# Patient Record
Sex: Female | Born: 1935 | Race: White | Hispanic: No | Smoking: Never smoker
Health system: Southern US, Community
[De-identification: ages and names within clinical notes are randomized; demographics above are authoritative.]

## PROBLEM LIST (undated history)

## (undated) DIAGNOSIS — Z955 Presence of coronary angioplasty implant and graft: Secondary | ICD-10-CM

## (undated) DIAGNOSIS — I340 Nonrheumatic mitral (valve) insufficiency: Secondary | ICD-10-CM

## (undated) DIAGNOSIS — R0789 Other chest pain: Secondary | ICD-10-CM

## (undated) DIAGNOSIS — I351 Nonrheumatic aortic (valve) insufficiency: Secondary | ICD-10-CM

## (undated) DIAGNOSIS — G629 Polyneuropathy, unspecified: Secondary | ICD-10-CM

## (undated) DIAGNOSIS — Z951 Presence of aortocoronary bypass graft: Secondary | ICD-10-CM

## (undated) DIAGNOSIS — I10 Essential (primary) hypertension: Secondary | ICD-10-CM

## (undated) DIAGNOSIS — E119 Type 2 diabetes mellitus without complications: Secondary | ICD-10-CM

## (undated) DIAGNOSIS — I2510 Atherosclerotic heart disease of native coronary artery without angina pectoris: Secondary | ICD-10-CM

## (undated) DIAGNOSIS — I739 Peripheral vascular disease, unspecified: Secondary | ICD-10-CM

## (undated) DIAGNOSIS — E782 Mixed hyperlipidemia: Secondary | ICD-10-CM

## (undated) HISTORY — DX: Peripheral vascular disease, unspecified: I73.9

## (undated) HISTORY — DX: Other chest pain: R07.89

## (undated) HISTORY — DX: Mixed hyperlipidemia: E78.2

## (undated) HISTORY — DX: Nonrheumatic aortic (valve) insufficiency: I35.1

## (undated) HISTORY — DX: Type 2 diabetes mellitus without complications: E11.9

## (undated) HISTORY — DX: Presence of coronary angioplasty implant and graft: Z95.5

## (undated) HISTORY — DX: Atherosclerotic heart disease of native coronary artery without angina pectoris: I25.10

## (undated) HISTORY — DX: Nonrheumatic mitral (valve) insufficiency: I34.0

## (undated) HISTORY — DX: Presence of aortocoronary bypass graft: Z95.1

## (undated) HISTORY — DX: Essential (primary) hypertension: I10

## (undated) HISTORY — DX: Polyneuropathy, unspecified: G62.9

---

## 2013-05-22 ENCOUNTER — Encounter: Payer: Self-pay | Admitting: Internal Medicine

## 2013-05-22 ENCOUNTER — Ambulatory Visit (INDEPENDENT_AMBULATORY_CARE_PROVIDER_SITE_OTHER): Payer: Medicare Other | Admitting: Internal Medicine

## 2013-05-22 VITALS — BP 160/60 | HR 60 | Temp 98.0°F | Resp 20 | Ht 62.0 in | Wt 117.0 lb

## 2013-05-22 DIAGNOSIS — Z0000 Encounter for general adult medical examination without abnormal findings: Secondary | ICD-10-CM

## 2013-05-22 DIAGNOSIS — E119 Type 2 diabetes mellitus without complications: Secondary | ICD-10-CM

## 2013-05-22 LAB — HEMOGLOBIN A1C: Hgb A1c MFr Bld: 6.3 % (ref 4.6–6.5)

## 2013-05-22 NOTE — Patient Instructions (Signed)
Limit your sodium (Salt) intake   Please check your hemoglobin A1c every 3 months  Return in 3 months for follow-up   

## 2013-05-22 NOTE — Progress Notes (Signed)
Subjective:    Patient ID: Teresa Hawkins, female    DOB: [birth year], 79 y.o.   MRN: [id number]  HPI  79 year old patient who is seen today to establish. Her primary residence is in [place] but will also be spending considerable time in [place].  Past medical history is provided by the patient. No records for review She has a history of extensive coronary artery disease and is status post multiple stenting prior to CABG about 5 years ago. She states she has had probably a single stent placed after her bypass surgery. She has treated hypertension and diabetes. Additional problems include gastroesophageal reflux disease she is allergic rhinitis and mild dyslipidemia. She has hypothyroidism. She has history also of CAD and status post bilateral stenting. She has stable claudication. She's also followed for carotid artery stenosis. She's had a recent complete GI evaluation do to weight loss. This included the upper and lower endoscopy as well as CT scanning. At the present time she feels generally well. She attributes her weight loss do to situational stress following the death of her husband one year ago Social history she has a first cousin of Dr. Roseanne Kaufman and grew up in the [place] area. Dr. Alease Medina was her physician years ago. Lifelong nonsmoker. Widowed for about one year  Surgical procedures have included a prior cholecystectomy appendectomy tonsillectomy hysterectomy in addition to CABG  1. Risk factors, based on past  M,S,F history- patient has known coronary artery disease. Risk factors include hypertension and dyslipidemia  2.  Physical activities: Stable claudication but no exercise restrictions  3.  Depression/mood: No history of mood disorder or anxiety  4.  Hearing: No deficits  5.  ADL's: Independent in all aspects of daily living  6.  Fall risk: Moderate due to age  38.  Home safety: No problems identified  8.  Height weight, and visual acuity; height and weight  stable no change in visual acuity. Has had bilateral cataract extraction surgery  9.  Counseling: Followup cardiology.  10. Lab orders based on risk factors: Will check a hemoglobin A1c  11. Referral : Not appropriate at this time  12. Care plan: Continue heart healthy diet and aggressive risk factor modification  13. Cognitive assessment: Alert and oriented with normal affect     Review of Systems  Constitutional: Negative for fever, appetite change, fatigue and unexpected weight change.  HENT: Negative for hearing loss, ear pain, nosebleeds, congestion, sore throat, mouth sores, trouble swallowing, neck stiffness, dental problem, voice change, sinus pressure and tinnitus.   Eyes: Negative for photophobia, pain, redness and visual disturbance.  Respiratory: Negative for cough, chest tightness and shortness of breath.   Cardiovascular: Negative for chest pain, palpitations and leg swelling.       Stable claudication  Gastrointestinal: Negative for nausea, vomiting, abdominal pain, diarrhea, constipation, blood in stool, abdominal distention and rectal pain.  Genitourinary: Negative for dysuria, urgency, frequency, hematuria, flank pain, vaginal bleeding, vaginal discharge, difficulty urinating, genital sores, vaginal pain, menstrual problem and pelvic pain.  Musculoskeletal: Negative for back pain and arthralgias.  Skin: Negative for rash.  Neurological: Negative for dizziness, syncope, speech difficulty, weakness, light-headedness, numbness and headaches.  Hematological: Negative for adenopathy. Does not bruise/bleed easily.  Psychiatric/Behavioral: Negative for suicidal ideas, behavioral problems, self-injury, dysphoric mood and agitation. The patient is not nervous/anxious.        Objective:   Physical Exam  Constitutional: She is oriented to person, place, and time. She appears well-developed and well-nourished.  Blood pressure 140/60  HENT:  Head: Normocephalic.  Right Ear:  External ear normal.  Left Ear: External ear normal.  Mouth/Throat: Oropharynx is clear and moist.  Eyes: Conjunctivae and EOM are normal. Pupils are equal, round, and reactive to light.  Neck: Normal range of motion. Neck supple. No thyromegaly present.  Right bruit  Cardiovascular: Normal rate, regular rhythm and intact distal pulses.   Murmur heard. Grade 2/6 systolic murmur  Bilateral femoral bruits Dorsalis pedis pulses faintly palpable Posterior tibial pulses absent  Pulmonary/Chest: Effort normal and breath sounds normal.  Sternotomy scar  Abdominal: Soft. Bowel sounds are normal. She exhibits no mass. There is no tenderness.  Musculoskeletal: Normal range of motion.  Lymphadenopathy:    She has no cervical adenopathy.  Neurological: She is alert and oriented to person, place, and time.  Skin: Skin is warm and dry. No rash noted.  Psychiatric: She has a normal mood and affect. Her behavior is normal.          Assessment & Plan:   Preventive health examination Coronary artery disease PA D. with stable claudication Carotid artery stenosis with right bruit Hypertension Dyslipidemia Hypothyroidism Diabetes mellitus. Will check a hemoglobin A1c  Recheck 3 months

## 2013-10-20 ENCOUNTER — Other Ambulatory Visit: Payer: Self-pay | Admitting: Internal Medicine

## 2013-10-21 ENCOUNTER — Encounter: Payer: Self-pay | Admitting: Internal Medicine

## 2014-04-19 ENCOUNTER — Other Ambulatory Visit: Payer: Self-pay | Admitting: Internal Medicine

## 2014-09-04 ENCOUNTER — Other Ambulatory Visit: Payer: Self-pay | Admitting: Internal Medicine

## 2016-12-27 DIAGNOSIS — G629 Polyneuropathy, unspecified: Secondary | ICD-10-CM

## 2016-12-27 HISTORY — DX: Polyneuropathy, unspecified: G62.9

## 2017-03-06 DIAGNOSIS — E119 Type 2 diabetes mellitus without complications: Secondary | ICD-10-CM

## 2017-03-06 HISTORY — DX: Type 2 diabetes mellitus without complications: E11.9

## 2017-08-13 ENCOUNTER — Other Ambulatory Visit: Payer: Self-pay

## 2017-08-13 DIAGNOSIS — I739 Peripheral vascular disease, unspecified: Secondary | ICD-10-CM

## 2017-08-15 ENCOUNTER — Encounter: Payer: Self-pay | Admitting: Vascular Surgery

## 2017-10-08 ENCOUNTER — Ambulatory Visit (INDEPENDENT_AMBULATORY_CARE_PROVIDER_SITE_OTHER): Payer: Medicare Other | Admitting: Vascular Surgery

## 2017-10-08 ENCOUNTER — Encounter: Payer: Self-pay | Admitting: *Deleted

## 2017-10-08 ENCOUNTER — Encounter: Payer: Self-pay | Admitting: Vascular Surgery

## 2017-10-08 ENCOUNTER — Other Ambulatory Visit: Payer: Self-pay | Admitting: *Deleted

## 2017-10-08 ENCOUNTER — Ambulatory Visit (HOSPITAL_COMMUNITY)
Admission: RE | Admit: 2017-10-08 | Discharge: 2017-10-08 | Disposition: A | Discharge status: Home / Self Care | Payer: Medicare Other | Source: Ambulatory Visit | Attending: Vascular Surgery | Admitting: Vascular Surgery

## 2017-10-08 ENCOUNTER — Ambulatory Visit (INDEPENDENT_AMBULATORY_CARE_PROVIDER_SITE_OTHER)
Admission: RE | Admit: 2017-10-08 | Discharge: 2017-10-08 | Disposition: A | Discharge status: Home / Self Care | Payer: Medicare Other | Source: Ambulatory Visit | Attending: Vascular Surgery | Admitting: Vascular Surgery

## 2017-10-08 VITALS — BP 164/62 | HR 81 | Temp 97.0°F | Resp 16 | Ht 62.0 in | Wt 120.8 lb

## 2017-10-08 DIAGNOSIS — I739 Peripheral vascular disease, unspecified: Secondary | ICD-10-CM

## 2017-10-08 DIAGNOSIS — Z95820 Peripheral vascular angioplasty status with implants and grafts: Secondary | ICD-10-CM | POA: Diagnosis not present

## 2017-10-08 DIAGNOSIS — E1151 Type 2 diabetes mellitus with diabetic peripheral angiopathy without gangrene: Secondary | ICD-10-CM | POA: Insufficient documentation

## 2017-10-08 DIAGNOSIS — E785 Hyperlipidemia, unspecified: Secondary | ICD-10-CM | POA: Insufficient documentation

## 2017-10-08 DIAGNOSIS — I10 Essential (primary) hypertension: Secondary | ICD-10-CM | POA: Insufficient documentation

## 2017-10-08 LAB — VAS US LOWER EXTREMITY ARTERIAL DUPLEX
LPOPPPSV: 31 cm/s
LPTIBDISTSYS: -22 cm/s
LSFPPSV: -127 cm/s
Left ant tibial distal sys: -18 cm/s
Left popliteal dist sys PSV: -25 cm/s
RATIBDISTSYS: -16 cm/s
RPOPDPSV: -55 cm/s
RTIBDISTSYS: -54 cm/s
Right peroneal sys PSV: 29 cm/s
Right popliteal prox sys PSV: 73 cm/s

## 2017-10-08 NOTE — Progress Notes (Signed)
Reviewed instructions with patient .  

## 2017-10-08 NOTE — Progress Notes (Signed)
History of Present Illness:  Patient is a 83 y.o. year old female who presents for evaluation of claudication.  She has had symptoms for greater than a year.  She lives here and [place].  In [place] she has an angiogram that resulted in Right femoral artery stent from mid thigh to distal thigh/proximal popliteal artherectomy of right tibio-peroneal trunk.  Left femoral artery stent proximal to distal thigh/proximalpopliteal and atherectomy of the anterior tibial artery with angioplasty of the peroneal artery and tibio-peroneal trunk.  Her pre operative symptoms did not resolve after her procedure.   She currently describes a cramping sensation in the both lower extremity. There is not rest pain.  There is no history of ulcerations on the feet.  Atherosclerotic risk factors and other medical problems include DM, HTN and history of CAD s/p cardiac by pass.    Past Medical History:  Diagnosis Date  . Aortic valve regurgitation   . Atypical chest pain   . Claudication (HCC)   . Coronary atherosclerosis   . Essential hypertension   . Hx of right coronary artery stent placement   . Mitral valve regurgitation   . Mixed hyperlipidemia   . Neuropathy 12/27/2016  . Peripheral vascular disease (HCC)   . Status post coronary artery bypass grafting   . Type 2 diabetes mellitus (HCC) 03/06/2017    History reviewed. No pertinent surgical history.  ROS:   General:  No weight loss, Fever, chills  HEENT: No recent headaches, no nasal bleeding, no visual changes, no sore throat  Neurologic: No dizziness, blackouts, seizures. No recent symptoms of stroke or mini- stroke. No recent episodes of slurred speech, or temporary blindness.  Cardiac: No recent episodes of chest pain/pressure, no shortness of breath at rest.  No shortness of breath with exertion.  Denies history of atrial fibrillation or irregular heartbeat  Vascular: No history of rest pain in feet.  No history of claudication.  No history of  non-healing ulcer, No history of DVT   Pulmonary: No home oxygen, no productive cough, no hemoptysis,  No asthma or wheezing  Musculoskeletal:  [ ]  Arthritis, [ ]  Low back pain,  [ ]  Joint pain  Hematologic:No history of hypercoagulable state.  No history of easy bleeding.  No history of anemia  Gastrointestinal: No hematochezia or melena,  No gastroesophageal reflux, no trouble swallowing  Urinary: [ ]  chronic Kidney disease, [ ]  on HD - [ ]  MWF or [ ]  TTHS, [ ]  Burning with urination, [ ]  Frequent urination, [ ]  Difficulty urinating;   Skin: No rashes  Psychological: No history of anxiety,  No history of depression  Social History Social History   Tobacco Use  . Smoking status: Never Smoker  . Smokeless tobacco: Never Used  Substance Use Topics  . Alcohol use: No  . Drug use: No    Family History History reviewed. No pertinent family history.  Allergies  Allergies  Allergen Reactions  . Statins Other (See Comments)    Muscle and joint aches     Current Outpatient Medications  Medication Sig Dispense Refill  . amLODipine-benazepril (LOTREL) 5-40 MG capsule Take 1 capsule by mouth daily.    . benazepril (LOTENSIN) 40 MG tablet Take 40 mg by mouth daily.    . Calcium Carb-Cholecalciferol ([employer] CALCIUM 600+D) 600-800 MG-UNIT TABS Take 1 tablet by mouth daily.    . carvedilol (COREG) 12.5 MG tablet Take 6.25 mg by mouth 2 (two) times daily with a meal.    .  clopidogrel (PLAVIX) 75 MG tablet Take 75 mg by mouth daily.    Marland Kitchen. doxazosin (CARDURA) 2 MG tablet TAKE 1 TABLET BY MOUTH ONCE DAILY FOR BLOOD PRESSURE 90 tablet 0  . gabapentin (NEURONTIN) 300 MG capsule Take 300 mg by mouth. ONE TABLET IN AM, TWO TABLETS IN PM.    . gemfibrozil (LOPID) 600 MG tablet Take 600 mg by mouth 2 (two) times daily before a meal.    . hydrochlorothiazide (HYDRODIURIL) 12.5 MG tablet Take 12.5 mg by mouth daily.    Marland Kitchen. HYDROcodone-acetaminophen (NORCO/VICODIN) 5-325 MG tablet Take 1 tablet by  mouth every 6 (six) hours as needed for moderate pain.    Marland Kitchen. insulin glargine (LANTUS) 100 UNIT/ML injection Inject into the skin at bedtime.    Marland Kitchen. levothyroxine (SYNTHROID, LEVOTHROID) 75 MCG tablet Take 75 mcg by mouth daily before breakfast.    . Multiple Vitamins-Minerals (MULTIVITAMIN GUMMIES ADULTS) CHEW Chew 2 each by mouth daily.    Marland Kitchen. omeprazole (PRILOSEC OTC) 20 MG tablet Take 20 mg by mouth daily.     No current facility-administered medications for this visit.     Physical Examination  Vitals:   10/08/17 1420  BP: (!) 164/62  Pulse: 81  Resp: 16  Temp: (!) 97 F (36.1 C)  TempSrc: Oral  SpO2: 95%  Weight: 120 lb 12.8 oz (54.8 kg)  Height: 5\' 2"  (1.575 m)    Body mass index is 22.09 kg/m.  General:  Alert and oriented, no acute distress HEENT: Normal Neck: No bruit or JVD Pulmonary: Clear to auscultation bilaterally Cardiac: Regular Rate and Rhythm positive holistic murmur Abdomen: Soft, non-tender, non-distended, no mass, no scars Skin: No rash, braun ey discoloration of B shins  Extremity Pulses:  2+ radial, brachial, femoral, dorsalis pedis,  pulses bilaterally Musculoskeletal: No deformity or edema  Neurologic: Upper and lower extremity motor 5/5 and symmetric  DATA:  Arterial duplex Right biphasic flow CFA ,SFA, monophasic flow popliteal and tibials Left CFA biphasic, SFA stent occluded distally, monophasic popliteal and tibials  ABI Right 0.61 Left 0.49   ASSESSMENT:  PAD with symptoms of claudication B s/p stents B SFA with occluded left stent with reconstitution of tibials   PLAN: She is very active and wants to continue to be.  She wants further intervention if it will reduce her symptoms.  She has no history of non healing wounds and no rest pain.  We will schedule her for an angiogram with possible intervention.  She is not at risk of limb loss at this time.     Mosetta PigeonEmma Maureen Collins PA-C [facility]  I have  examined the patient, reviewed and agree with above.  I had a long discussion with the patient and her daughter present.  Have recommended observation only related to her lower extremity claudication especially in light of her advanced age of 89.  Patient is adamant that she is not willing to adhere to this recommendation.  Once further evaluation to determine if any options are possible for improvement in her walking symptoms.  Explained we could proceed with diagnostic arteriography to determine options.  She wishes to proceed and we will schedule this as an outpatient  Gretta Beganodd Tallin Hart, MD 10/08/2017 7:09 PM

## 2017-10-23 ENCOUNTER — Encounter (HOSPITAL_COMMUNITY)
Admission: RE | Disposition: A | Discharge status: Home / Self Care | Payer: Self-pay | Source: Ambulatory Visit | Attending: Vascular Surgery

## 2017-10-23 ENCOUNTER — Ambulatory Visit (HOSPITAL_COMMUNITY)
Admission: RE | Admit: 2017-10-23 | Discharge: 2017-10-23 | Disposition: A | Discharge status: Home / Self Care | Payer: Medicare Other | Source: Ambulatory Visit | Attending: Vascular Surgery | Admitting: Vascular Surgery

## 2017-10-23 DIAGNOSIS — Z951 Presence of aortocoronary bypass graft: Secondary | ICD-10-CM | POA: Diagnosis not present

## 2017-10-23 DIAGNOSIS — E1140 Type 2 diabetes mellitus with diabetic neuropathy, unspecified: Secondary | ICD-10-CM | POA: Insufficient documentation

## 2017-10-23 DIAGNOSIS — Z79890 Hormone replacement therapy (postmenopausal): Secondary | ICD-10-CM | POA: Insufficient documentation

## 2017-10-23 DIAGNOSIS — I10 Essential (primary) hypertension: Secondary | ICD-10-CM | POA: Diagnosis not present

## 2017-10-23 DIAGNOSIS — I2510 Atherosclerotic heart disease of native coronary artery without angina pectoris: Secondary | ICD-10-CM | POA: Diagnosis not present

## 2017-10-23 DIAGNOSIS — Y831 Surgical operation with implant of artificial internal device as the cause of abnormal reaction of the patient, or of later complication, without mention of misadventure at the time of the procedure: Secondary | ICD-10-CM | POA: Insufficient documentation

## 2017-10-23 DIAGNOSIS — Z95820 Peripheral vascular angioplasty status with implants and grafts: Secondary | ICD-10-CM | POA: Insufficient documentation

## 2017-10-23 DIAGNOSIS — Z79899 Other long term (current) drug therapy: Secondary | ICD-10-CM | POA: Insufficient documentation

## 2017-10-23 DIAGNOSIS — E1151 Type 2 diabetes mellitus with diabetic peripheral angiopathy without gangrene: Secondary | ICD-10-CM | POA: Insufficient documentation

## 2017-10-23 DIAGNOSIS — I739 Peripheral vascular disease, unspecified: Secondary | ICD-10-CM | POA: Diagnosis present

## 2017-10-23 DIAGNOSIS — Z794 Long term (current) use of insulin: Secondary | ICD-10-CM | POA: Insufficient documentation

## 2017-10-23 DIAGNOSIS — T82898A Other specified complication of vascular prosthetic devices, implants and grafts, initial encounter: Secondary | ICD-10-CM | POA: Insufficient documentation

## 2017-10-23 DIAGNOSIS — Z7902 Long term (current) use of antithrombotics/antiplatelets: Secondary | ICD-10-CM | POA: Diagnosis not present

## 2017-10-23 DIAGNOSIS — Z955 Presence of coronary angioplasty implant and graft: Secondary | ICD-10-CM | POA: Diagnosis not present

## 2017-10-23 DIAGNOSIS — I70213 Atherosclerosis of native arteries of extremities with intermittent claudication, bilateral legs: Secondary | ICD-10-CM | POA: Diagnosis not present

## 2017-10-23 HISTORY — PX: PERIPHERAL VASCULAR INTERVENTION: CATH118257

## 2017-10-23 HISTORY — PX: ABDOMINAL AORTOGRAM W/LOWER EXTREMITY: CATH118223

## 2017-10-23 LAB — POCT I-STAT, CHEM 8
BUN: 36 mg/dL — ABNORMAL HIGH (ref 6–20)
CHLORIDE: 103 mmol/L (ref 101–111)
Calcium, Ion: 1.14 mmol/L — ABNORMAL LOW (ref 1.15–1.40)
Creatinine, Ser: 0.9 mg/dL (ref 0.44–1.00)
GLUCOSE: 197 mg/dL — AB (ref 65–99)
HCT: 38 % (ref 36.0–46.0)
HEMOGLOBIN: 12.9 g/dL (ref 12.0–15.0)
POTASSIUM: 4.2 mmol/L (ref 3.5–5.1)
Sodium: 136 mmol/L (ref 135–145)
TCO2: 25 mmol/L (ref 22–32)

## 2017-10-23 LAB — POCT ACTIVATED CLOTTING TIME
ACTIVATED CLOTTING TIME: 219 s
ACTIVATED CLOTTING TIME: 197 s
Activated Clotting Time: 208 seconds
Activated Clotting Time: 186 seconds

## 2017-10-23 LAB — GLUCOSE, CAPILLARY: GLUCOSE-CAPILLARY: 158 mg/dL — AB (ref 65–99)

## 2017-10-23 SURGERY — ABDOMINAL AORTOGRAM W/LOWER EXTREMITY
Anesthesia: LOCAL

## 2017-10-23 MED ORDER — SODIUM CHLORIDE 0.9 % IV SOLN: 250.0000 mL | INTRAVENOUS | Status: DC | PRN

## 2017-10-23 MED ORDER — MIDAZOLAM HCL 2 MG/2ML IJ SOLN
INTRAMUSCULAR | Status: AC
  Filled 2017-10-23: qty 2

## 2017-10-23 MED ORDER — FENTANYL CITRATE (PF) 100 MCG/2ML IJ SOLN
INTRAMUSCULAR | Status: AC
  Filled 2017-10-23: qty 2

## 2017-10-23 MED ORDER — FENTANYL CITRATE (PF) 100 MCG/2ML IJ SOLN
INTRAMUSCULAR | Status: DC | PRN
  Administered 2017-10-23 (×2): 25 ug via INTRAVENOUS

## 2017-10-23 MED ORDER — VIPERSLIDE LUBRICANT OPTIME
TOPICAL | Status: DC | PRN
  Administered 2017-10-23: 10:00:00 via SURGICAL_CAVITY

## 2017-10-23 MED ORDER — LIDOCAINE HCL (PF) 1 % IJ SOLN
INTRAMUSCULAR | Status: AC
  Filled 2017-10-23: qty 30

## 2017-10-23 MED ORDER — MIDAZOLAM HCL 2 MG/2ML IJ SOLN
INTRAMUSCULAR | Status: DC | PRN
  Administered 2017-10-23: 1 mg via INTRAVENOUS

## 2017-10-23 MED ORDER — SODIUM CHLORIDE 0.9% FLUSH: 3.0000 mL | INTRAVENOUS | Status: DC | PRN

## 2017-10-23 MED ORDER — SODIUM CHLORIDE 0.9% FLUSH: 3.0000 mL | Freq: Two times a day (BID) | INTRAVENOUS | Status: DC

## 2017-10-23 MED ORDER — ASPIRIN EC 81 MG PO TBEC: 81.0000 mg | DELAYED_RELEASE_TABLET | Freq: Every day | ORAL | Status: DC

## 2017-10-23 MED ORDER — LABETALOL HCL 5 MG/ML IV SOLN
10.0000 mg | INTRAVENOUS | Status: DC | PRN
Start: 2017-10-23 — End: 2017-10-23

## 2017-10-23 MED ORDER — HEPARIN SODIUM (PORCINE) 1000 UNIT/ML IJ SOLN
INTRAMUSCULAR | Status: AC
  Filled 2017-10-23: qty 1

## 2017-10-23 MED ORDER — HYDRALAZINE HCL 20 MG/ML IJ SOLN
INTRAMUSCULAR | Status: DC | PRN
  Administered 2017-10-23 (×2): 10 mg via INTRAVENOUS

## 2017-10-23 MED ORDER — IODIXANOL 320 MG/ML IV SOLN
INTRAVENOUS | Status: DC | PRN
  Administered 2017-10-23: 140 mL via INTRA_ARTERIAL

## 2017-10-23 MED ORDER — HYDRALAZINE HCL 20 MG/ML IJ SOLN
5.0000 mg | INTRAMUSCULAR | Status: DC | PRN
Start: 2017-10-23 — End: 2017-10-23

## 2017-10-23 MED ORDER — SODIUM CHLORIDE 0.9 % IV SOLN
INTRAVENOUS | Status: DC
  Administered 2017-10-23: 08:00:00 via INTRAVENOUS

## 2017-10-23 MED ORDER — HEPARIN (PORCINE) IN NACL 2-0.9 UNIT/ML-% IJ SOLN
INTRAMUSCULAR | Status: AC | PRN
  Administered 2017-10-23: 1000 mL

## 2017-10-23 MED ORDER — HEPARIN (PORCINE) IN NACL 2-0.9 UNIT/ML-% IJ SOLN
INTRAMUSCULAR | Status: AC
  Filled 2017-10-23: qty 1000

## 2017-10-23 MED ORDER — HEPARIN SODIUM (PORCINE) 1000 UNIT/ML IJ SOLN
INTRAMUSCULAR | Status: DC | PRN
  Administered 2017-10-23: 6000 [IU] via INTRAVENOUS
  Administered 2017-10-23: 2000 [IU] via INTRAVENOUS

## 2017-10-23 MED ORDER — SODIUM CHLORIDE 0.9 % WEIGHT BASED INFUSION: 1.0000 mL/kg/h | INTRAVENOUS | Status: DC

## 2017-10-23 MED ORDER — OXYCODONE HCL 5 MG PO TABS: 5.0000 mg | ORAL_TABLET | ORAL | Status: DC | PRN

## 2017-10-23 SURGICAL SUPPLY — 28 items
BAG SNAP BAND KOVER 36X36 (MISCELLANEOUS) ×3 IMPLANT
BALLN STERLING OTW 5X220X150 (BALLOONS) ×3
BALLOON STERLING OTW 5X220X150 (BALLOONS) ×2 IMPLANT
BUR JETSTREAM XC 2.1/3.0 (BURR) ×2 IMPLANT
BURR JETSTREAM XC 2.1/3.0 (BURR) ×3
CATH ANGIO 5F BER2 65CM (CATHETERS) ×3 IMPLANT
CATH OMNI FLUSH 5F 65CM (CATHETERS) ×3 IMPLANT
CATH QUICKCROSS .035X135CM (MICROCATHETER) ×3 IMPLANT
COVER PRB 48X5XTLSCP FOLD TPE (BAG) ×2 IMPLANT
COVER PROBE 5X48 (BAG) ×1
DEVICE CONTINUOUS FLUSH (MISCELLANEOUS) ×3 IMPLANT
DEVICE EMBOSHIELD NAV6 2.5-4.8 (WIRE) ×3 IMPLANT
DEVICE TORQUE .025-.038 (MISCELLANEOUS) ×3 IMPLANT
GUIDEWIRE ANGLED .035X260CM (WIRE) ×3 IMPLANT
KIT ENCORE 26 ADVANTAGE (KITS) ×3 IMPLANT
KIT MICROINTRODUCER STIFF 5F (SHEATH) ×3 IMPLANT
KIT PV (KITS) ×3 IMPLANT
LUBRICANT VIPERSLIDE CORONARY (MISCELLANEOUS) ×3 IMPLANT
SHEATH HIGHFLEX ANSEL 7FR 55CM (SHEATH) ×3 IMPLANT
SHEATH PINNACLE 5F 10CM (SHEATH) ×3 IMPLANT
STENT VIABAHN 5X100X120 (Permanent Stent) ×3 IMPLANT
STENT VIABAHN 5X250X120 (Permanent Stent) ×3 IMPLANT
SYR MEDRAD MARK V 150ML (SYRINGE) ×3 IMPLANT
TRANSDUCER W/STOPCOCK (MISCELLANEOUS) ×3 IMPLANT
TRAY PV CATH (CUSTOM PROCEDURE TRAY) ×3 IMPLANT
WIRE BAREWIRE WORK .014X315CM (WIRE) ×3 IMPLANT
WIRE BENTSON .035X145CM (WIRE) ×3 IMPLANT
WIRE G V18X300CM (WIRE) ×3 IMPLANT

## 2017-10-23 NOTE — H&P (Signed)
   History and Physical Update  The patient was interviewed and re-examined.  The patient's previous History and Physical has been reviewed and is unchanged from Dr. Arbie CookeyEarly office note. She has short distance claudication in the left more than the right. History of occluded sfa stents with a failed recanalization attempt in the past. Will attempt left leg revascularization today.  Teresa Hawkins C. Randie Heinzain, MD [facility] Office: [phone] Pager: [phone]   10/23/2017, 9:04 AM

## 2017-10-23 NOTE — Progress Notes (Signed)
Rt groin dressing saturated and oozing out from dressing. No hematoma. Last ACT 186 at 1229

## 2017-10-23 NOTE — Discharge Instructions (Signed)

## 2017-10-23 NOTE — Progress Notes (Signed)
Site area: rt groin fa sheath Site Prior to Removal:  Level 0 Pressure Applied For: 30 minutes Manual:   yes Patient Status During Pull:  stable Post Pull Site:  Level  0 Post Pull Instructions Given:  yes Post Pull Pulses Present: yes Dressing Applied:  Gauze and tegaderm Bedrest begins @ 1330 Comments:

## 2017-10-23 NOTE — Op Note (Signed)
Patient name: Teresa Hawkins MRN: [id number] DOB: 1921-08-02 Sex: female  10/23/2017 Pre-operative Diagnosis: claudication Post-operative diagnosis:  Same Surgeon:  Apolinar Junes C. Randie Heinz, MD Procedure Performed: 1.  US guided cannulation of left common femoral artery 2.  Aortogram with bilateral runoff 3.  Atherectomy of left SFA and popliteal with jetstream 2.1-3.0 xc 4.  Stent of left sfa and popliteal arteries with 5 x and 5 x viabahn stents 5.  Moderate sedation with fentanyl and versed for 96 minutes   Indications: 84 year old female with history of stenting of her bilateral superficial femoral arteries.  She now has short distance life-limiting claudication and is indicated for angiogram with possible intervention.  Findings: Aorta and iliac segments have diffuse disease but no flow-limiting stenoses.  On the left there is a diminutive proximal SFA followed by multiple stents in the mid SFA to the popliteal artery above the knee that occluded above the adductor.  There appears to be a covered stent distally.  She reconstitutes above the knee popliteal artery and has dominant posterior tibial artery runoff to the foot.  On the right side the SFA appears as a string proximally and then there is a patent stent in the mid SFA and she appears to only have posterior tibial runoff.  Following intervention on the SFA and popliteal arteries there is no longer occlusion and now there are patent stents.   Procedure:  The patient was identified in the holding area and taken to room 8.  The patient was then placed supine on the table and prepped and draped in the usual sterile fashion.  A time out was called.  Ultrasound was used to evaluate the right common femoral artery.  It was patent .  A digital ultrasound image was acquired.  A micropuncture needle was used to access the right common femoral artery under ultrasound guidance.  An 018 wire was advanced without resistance and a micropuncture  sheath was placed.  The 018 wire was removed and a benson wire was placed.  The micropuncture sheath was exchanged for a 5 french sheath.  An omniflush catheter was advanced over the wire to the level of L-1.  An abdominal angiogram was obtained followed by bilateral lower extremity runoff.  With the above findings we were able to go up and over with a Bentson wire and Omni catheter.  We then placed a long 7 French sheath and the patient was given 6000 units of heparin followed by an additional 2000 after the ACT returned.  We used a bare catheter to select the SFA and then used a Glidewire and quick cross catheter to cross the occluded SFA stents and demonstrated intraluminal access at the knee.  We then exchanged for a bare wire followed by a small nav 6 distal embolic protection device.  We perform jetstream atherectomy.  Following this we performed 5 mm balloon angioplasty of the entirety of the stents.  Following this we performed angiogram and the distal stent was still occluded and there was the presence of an AV fistula.  We then removed our embolic protection device and had a similar result.  With this we elected to stent distally to proximally.  We exchanged through a catheter after again demonstrating intraluminal access and placed a V 18 wire into the anterior tibial artery.  A 5 x 250 mm stent was then placed distally followed by a 5 x 100 mm stent proximally.  We then postdilated these with 5 mm balloon.  Following  this we had no further occlusions in the SFA or popliteal had brisk runoff with posterior tibial being the dominant runoff to the foot.  Patient tolerated procedure well.  We then removed the sheath back to the external iliac artery on the right and the wire was removed.  Sheath was pulled in postop holding.  Contrast 140 cc.   Issabelle Mcraney C. Randie Heinzain, MD [facility] Office: [phone] Pager: [phone]

## 2017-10-24 ENCOUNTER — Encounter (HOSPITAL_COMMUNITY): Payer: Self-pay | Admitting: Vascular Surgery

## 2017-10-24 ENCOUNTER — Telehealth: Payer: Self-pay | Admitting: Vascular Surgery

## 2017-10-24 NOTE — Telephone Encounter (Signed)
-----   Message from Sharee PimpleMarilyn K McChesney, RN sent at 10/24/2017 10:37 AM EST ----- Regarding: 3-4 weeks    ----- Message ----- From: Maeola Harmanain, Brandon Christopher, MD Sent: 10/23/2017  12:02 PM To: [facility] Charge Pool  Collier Bullockdith Johndrow [id number] Feb 01, 1921  10/23/2017 Pre-operative Diagnosis: claudication  Surgeon:  Apolinar JunesBrandon C. Randie Heinzain, MD  Procedure Performed: 1.  US guided cannulation of left common femoral artery 2.  Aortogram with bilateral runoff 3.  Atherectomy of left SFA and popliteal with jetstream 2.1-3.0 xc 4.  Stent of left sfa and popliteal arteries with 5 x 250mm and 5 x 100mm viabahn stents 5.  Moderate sedation with fentanyl and versed for 96 minutes  F/u in 3-4 weeks with left lower extremity duplex and ABI's

## 2017-10-24 NOTE — Telephone Encounter (Signed)
Sched labs 11/11/17 at 1:00 and MD 11/22/17 at 11:30. Lm on cell#.

## 2017-10-25 ENCOUNTER — Other Ambulatory Visit: Payer: Self-pay

## 2017-10-25 DIAGNOSIS — Z48812 Encounter for surgical aftercare following surgery on the circulatory system: Secondary | ICD-10-CM

## 2017-10-25 DIAGNOSIS — Z48813 Encounter for surgical aftercare following surgery on the respiratory system: Secondary | ICD-10-CM

## 2017-10-25 DIAGNOSIS — I739 Peripheral vascular disease, unspecified: Secondary | ICD-10-CM

## 2017-11-04 ENCOUNTER — Telehealth: Payer: Self-pay | Admitting: Vascular Surgery

## 2017-11-04 NOTE — Telephone Encounter (Signed)
Pt lm w/ triage asking for appt. Appt already made and I had lm on their cell#. Lm on cell# for pt to call back.

## 2017-11-04 NOTE — Telephone Encounter (Signed)
-----   Message from Sharee PimpleMarilyn K McChesney, RN sent at 11/04/2017  2:56 PM EST ----- Regarding: This may be a duplicate, but pt called for this appt    ----- Message ----- From: Maeola Harmanain, Brandon Christopher, MD Sent: 10/23/2017  12:02 PM To: [facility] Charge Pool  Collier Bullockdith Giuliani [id number] 1921/07/31  10/23/2017 Pre-operative Diagnosis: claudication  Surgeon:  Apolinar JunesBrandon C. Randie Heinzain, MD  Procedure Performed: 1.  US guided cannulation of left common femoral artery 2.  Aortogram with bilateral runoff 3.  Atherectomy of left SFA and popliteal with jetstream 2.1-3.0 xc 4.  Stent of left sfa and popliteal arteries with 5 x 250mm and 5 x 100mm viabahn stents 5.  Moderate sedation with fentanyl and versed for 96 minutes  F/u in 3-4 weeks with left lower extremity duplex and ABI's

## 2017-11-11 ENCOUNTER — Ambulatory Visit (INDEPENDENT_AMBULATORY_CARE_PROVIDER_SITE_OTHER)
Admission: RE | Admit: 2017-11-11 | Discharge: 2017-11-11 | Disposition: A | Discharge status: Home / Self Care | Payer: Medicare Other | Source: Ambulatory Visit | Attending: Vascular Surgery | Admitting: Vascular Surgery

## 2017-11-11 ENCOUNTER — Ambulatory Visit (HOSPITAL_COMMUNITY)
Admission: RE | Admit: 2017-11-11 | Discharge: 2017-11-11 | Disposition: A | Discharge status: Home / Self Care | Payer: Medicare Other | Source: Ambulatory Visit | Attending: Vascular Surgery | Admitting: Vascular Surgery

## 2017-11-11 DIAGNOSIS — I739 Peripheral vascular disease, unspecified: Secondary | ICD-10-CM

## 2017-11-11 DIAGNOSIS — Z48812 Encounter for surgical aftercare following surgery on the circulatory system: Secondary | ICD-10-CM

## 2017-11-11 DIAGNOSIS — I70202 Unspecified atherosclerosis of native arteries of extremities, left leg: Secondary | ICD-10-CM | POA: Insufficient documentation

## 2017-11-11 LAB — VAS US LOWER EXTREMITY ARTERIAL DUPLEX: Right super femoral prox sys PSV: 203 cm/s

## 2017-11-13 ENCOUNTER — Other Ambulatory Visit: Payer: Self-pay

## 2017-11-13 ENCOUNTER — Emergency Department (HOSPITAL_COMMUNITY): Payer: Medicare Other

## 2017-11-13 ENCOUNTER — Encounter (HOSPITAL_COMMUNITY): Payer: Self-pay | Admitting: Family Medicine

## 2017-11-13 ENCOUNTER — Emergency Department (HOSPITAL_COMMUNITY)
Admission: EM | Admit: 2017-11-13 | Discharge: 2017-11-13 | Disposition: A | Discharge status: Home / Self Care | Payer: Medicare Other | Attending: Emergency Medicine | Admitting: Emergency Medicine

## 2017-11-13 DIAGNOSIS — Y999 Unspecified external cause status: Secondary | ICD-10-CM | POA: Insufficient documentation

## 2017-11-13 DIAGNOSIS — Z951 Presence of aortocoronary bypass graft: Secondary | ICD-10-CM | POA: Diagnosis not present

## 2017-11-13 DIAGNOSIS — I2510 Atherosclerotic heart disease of native coronary artery without angina pectoris: Secondary | ICD-10-CM | POA: Insufficient documentation

## 2017-11-13 DIAGNOSIS — S0083XA Contusion of other part of head, initial encounter: Secondary | ICD-10-CM | POA: Insufficient documentation

## 2017-11-13 DIAGNOSIS — Y939 Activity, unspecified: Secondary | ICD-10-CM | POA: Diagnosis not present

## 2017-11-13 DIAGNOSIS — S0990XA Unspecified injury of head, initial encounter: Secondary | ICD-10-CM | POA: Diagnosis present

## 2017-11-13 DIAGNOSIS — Y92003 Bedroom of unspecified non-institutional (private) residence as the place of occurrence of the external cause: Secondary | ICD-10-CM | POA: Insufficient documentation

## 2017-11-13 DIAGNOSIS — W2209XA Striking against other stationary object, initial encounter: Secondary | ICD-10-CM | POA: Insufficient documentation

## 2017-11-13 DIAGNOSIS — S51812A Laceration without foreign body of left forearm, initial encounter: Secondary | ICD-10-CM | POA: Insufficient documentation

## 2017-11-13 DIAGNOSIS — I10 Essential (primary) hypertension: Secondary | ICD-10-CM | POA: Insufficient documentation

## 2017-11-13 DIAGNOSIS — Z7902 Long term (current) use of antithrombotics/antiplatelets: Secondary | ICD-10-CM | POA: Insufficient documentation

## 2017-11-13 DIAGNOSIS — E1140 Type 2 diabetes mellitus with diabetic neuropathy, unspecified: Secondary | ICD-10-CM | POA: Diagnosis not present

## 2017-11-13 DIAGNOSIS — Z79899 Other long term (current) drug therapy: Secondary | ICD-10-CM | POA: Diagnosis not present

## 2017-11-13 NOTE — ED Provider Notes (Signed)
[facility]-EMERGENCY DEPT Provider Note   CSN: [id number] Arrival date & time: 11/13/17  1657     History   Chief Complaint Chief Complaint  Patient presents with  . Fall    HPI Teresa Hawkins is a 84 y.o. female.  Chief complaint is fall, forehead abrasion, left arm skin tear  HPI : 84 year old female.  She is here with her daughter.  She had a fall at home.  She takes Plavix and aspirin.  She returned from her bed and fell and struck her forehead against the edge of a stationary cabinet.  No loss of consciousness.  No confusion no weakness.  Earlier the today she was walking out of her room and caught her left forearm on a protruding sharp object on a door jam and has a skin tear to her left forearm as well.  No pain.  Past Medical History:  Diagnosis Date  . Aortic valve regurgitation   . Atypical chest pain   . Claudication (HCC)   . Coronary atherosclerosis   . Essential hypertension   . Hx of right coronary artery stent placement   . Mitral valve regurgitation   . Mixed hyperlipidemia   . Neuropathy 12/27/2016  . Peripheral vascular disease (HCC)   . Status post coronary artery bypass grafting   . Type 2 diabetes mellitus (HCC) 03/06/2017    There are no active problems to display for this patient.   Past Surgical History:  Procedure Laterality Date  . ABDOMINAL AORTOGRAM W/LOWER EXTREMITY N/A 10/23/2017   Procedure: ABDOMINAL AORTOGRAM W/LOWER EXTREMITY;  Surgeon: Maeola Harman, MD;  Location: [facility] INVASIVE CV LAB;  Service: Cardiovascular;  Laterality: N/A;  Bilateral   . PERIPHERAL VASCULAR INTERVENTION  10/23/2017   Procedure: PERIPHERAL VASCULAR INTERVENTION;  Surgeon: Maeola Harman, MD;  Location: [facility] INVASIVE CV LAB;  Service: Cardiovascular;;  Lt. SFA    OB History    No data available       Home Medications    Prior to Admission medications   Medication Sig Start Date End Date Taking? Authorizing Provider    benazepril (LOTENSIN) 40 MG tablet Take 40 mg by mouth daily.   Yes [provider]  CALCIUM-VITAMIN D PO Take 1 tablet by mouth at bedtime.   Yes [provider]  carvedilol (COREG) 6.25 MG tablet Take 6.25 mg by mouth 2 (two) times daily.   Yes [provider]  clopidogrel (PLAVIX) 75 MG tablet Take 75 mg by mouth daily.   Yes [provider]  doxazosin (CARDURA) 2 MG tablet TAKE 1 TABLET BY MOUTH ONCE DAILY FOR BLOOD PRESSURE Patient taking differently: TAKE 1 TABLET BY MOUTH ONCE DAILY AT NIGHT FOR BLOOD PRESSURE 09/06/14  Yes Gordy Savers, MD  gabapentin (NEURONTIN) 300 MG capsule Take 300 mg by mouth 2 (two) times daily. TAKE 1 CAPSULE (300MG ) IN THE MORNING & 2 CAPSULES (600MG ) BY MOUTH IN THE EVENING.   Yes [provider]  hydrochlorothiazide (HYDRODIURIL) 12.5 MG tablet Take 12.5 mg by mouth daily.   Yes [provider]  insulin glargine (LANTUS) 100 UNIT/ML injection Inject 10 Units into the skin at bedtime. BASED ON SLIDING SCALE TYPICALLY USES 10 UNITS   Yes [provider]  levothyroxine (SYNTHROID, LEVOTHROID) 75 MCG tablet Take 75 mcg by mouth at bedtime.    Yes [provider]  Multiple Vitamins-Minerals (MULTIVITAMIN GUMMIES ADULTS) CHEW Chew 2 each by mouth daily. CENTRUM   Yes [provider]  omeprazole (PRILOSEC OTC) 20 MG tablet Take 20 mg by mouth daily before breakfast.    Yes [provider]  Polyethyl Glycol-Propyl Glycol (LUBRICANT EYE DROPS) 0.4-0.3 % SOLN Place 1-2 drops into both eyes 3 (three) times daily as needed (for dry eyes.).   Yes [provider]    Family History History reviewed. No pertinent family history.  Social History Social History   Tobacco Use  . Smoking status: Never Smoker  . Smokeless tobacco: Never Used  Substance Use Topics  . Alcohol use: No  . Drug use: No     Allergies   Monosodium glutamate and Statins   Review of  Systems Review of Systems  Constitutional: Negative for appetite change, chills, diaphoresis, fatigue and fever.  HENT: Negative for mouth sores, sore throat and trouble swallowing.   Eyes: Negative for visual disturbance.  Respiratory: Negative for cough, chest tightness, shortness of breath and wheezing.   Cardiovascular: Negative for chest pain.  Gastrointestinal: Negative for abdominal distention, abdominal pain, diarrhea, nausea and vomiting.  Endocrine: Negative for polydipsia, polyphagia and polyuria.  Genitourinary: Negative for dysuria, frequency and hematuria.  Musculoskeletal: Negative for gait problem.  Skin: Negative for color change, pallor and rash.       Skin tear  Neurological: Positive for headaches. Negative for dizziness, syncope and light-headedness.  Hematological: Does not bruise/bleed easily.  Psychiatric/Behavioral: Negative for behavioral problems and confusion.     Physical Exam Updated Vital Signs BP (!) 159/64   Pulse 65   Temp 97.8 F (36.6 C) (Oral)   Resp 16   Ht 5\' 2"  (1.575 m)   Wt 54.4 kg (120 lb)   SpO2 95%   BMI 21.95 kg/m   Physical Exam  Constitutional: She is oriented to person, place, and time. She appears well-developed and well-nourished. No distress.  HENT:  Head: Normocephalic.  Minimal abrasion/laceration on the midline forehead.  No surrounding marked ecchymosis or soft tissue swelling.  Partial-thickness laceration.  Minimal bleeding noted.  No blood over the TMs, mastoids, or from ears nose or mouth.  Nontender over the midline neck and spine.  Eyes: Conjunctivae are normal. Pupils are equal, round, and reactive to light. No scleral icterus.  Neck: Normal range of motion. Neck supple. No thyromegaly present.  Cardiovascular: Normal rate and regular rhythm. Exam reveals no gallop and no friction rub.  No murmur heard. Pulmonary/Chest: Effort normal and breath sounds normal. No respiratory distress. She has no wheezes. She has no  rales.  Abdominal: Soft. Bowel sounds are normal. She exhibits no distension. There is no tenderness. There is no rebound.  Musculoskeletal: Normal range of motion.  Neurological: She is alert and oriented to person, place, and time.  Skin: Skin is warm and dry. No rash noted.  10 cm curvilinear skin tear on the left forearm.  Normal neurovascular and tendon exam distally.  Full range of motion.  Psychiatric: She has a normal mood and affect. Her behavior is normal.     ED Treatments / Results  Labs (all labs ordered are listed, but only abnormal results are displayed) Labs Reviewed - No data to display  EKG  EKG Interpretation None       Radiology Ct Head Wo Contrast  Result Date: 11/13/2017 CLINICAL DATA:  Dizziness. Patient fell this morning with an abrasion on the forehead. EXAM: CT HEAD WITHOUT CONTRAST TECHNIQUE: Contiguous axial images were obtained from the base of the skull through the vertex without intravenous contrast. COMPARISON:  None. FINDINGS:  Brain: Mild, likely age-appropriate atrophy with sulcal prominence centralized volume loss with commensurate ex vacuo dilatation of the ventricular system. Scattered minimal periventricular hypodensities compatible microvascular ischemic disease. Gray-white differentiation is otherwise well maintained without CT evidence of acute large territory infarct. No intraparenchymal or extra-axial mass or hemorrhage. Normal configuration of the ventricles and basilar cisterns. No midline shift. Vascular: Intracranial atherosclerosis. If skull: No displaced calvarial fracture with special attention paid to the forehead. Sinuses/Orbits: Limited visualization of the paranasal sinuses and mastoid air cells is normal. No air-fluid levels. Post bilateral cataract surgery. Other: Suspected minimal amount of soft tissue swelling about the midline of the forehead (image 14, series 2; sagittal image 29, series 6) without associated radiopaque foreign body  or displaced calvarial fracture. IMPRESSION: 1. Minimal amount of soft tissue swelling about the midline of the forehead without associated displaced calvarial fracture, radiopaque foreign body or acute intracranial process. 2. Mild, likely age-appropriate, atrophy and microvascular ischemic disease. Electronically Signed   By: Simonne Come M.D.   On: 11/13/2017 18:20    Procedures Procedures (including critical care time)  Medications Ordered in ED Medications - No data to display   Initial Impression / Assessment and Plan / ED Course  I have reviewed the triage vital signs and the nursing notes.  Pertinent labs & imaging results that were available during my care of the patient were reviewed by me and considered in my medical decision making (see chart for details).    She prominence around the area of the contusion of the forehead.  No intracerebral abnormality paraxial fluid or blood collection.  Wounds were dressed.  Discharged home.  Continue Plavix.  Final Clinical Impressions(s) / ED Diagnoses   Final diagnoses:  Contusion of forehead, initial encounter  Skin tear of left forearm without complication, initial encounter    ED Discharge Orders    None       Rolland Porter, MD 11/13/17 [facility] B[facility]

## 2017-11-13 NOTE — Discharge Instructions (Signed)
Leave dressing on forearm for 48 hours.   After 48 hours, remove daily, clean with soap and water, apply antibiotic ointment, then re-cover with bandage.

## 2017-11-13 NOTE — ED Triage Notes (Signed)
Patient reports she took OTC sleep aid two nights ago that she normally does not take. Yesterday, she reports she felt woozy all day. This morning she fell, hit her head on the cabinet, with no LOC. Patient has an abrasion with band-aid on the mid forehead. Patient does take Warfarin. Later today, she hit her left arm on the cabinet and it has been bleeding. Patient went to urgent care and referred to ED for CT of head. Patient is at baseline.

## 2017-11-22 ENCOUNTER — Ambulatory Visit: Payer: Medicare Other | Admitting: Vascular Surgery

## 2019-06-28 IMAGING — CT CT HEAD W/O CM
3 series · 15 of 47 positions shown, 18 images · non-contrast
Comparison: None.

CLINICAL DATA: Dizziness. Patient fell this morning with an
abrasion on the forehead.

EXAM:
CT HEAD WITHOUT CONTRAST
TECHNIQUE: Contiguous axial images were obtained from the base of the skull
through the vertex without intravenous contrast.

[Series 2: head wo · axial · 0.38mm/px · z∈[-205,-80]mm · 9 of 30 slices shown, 12 images]
[im 3/30  brain]
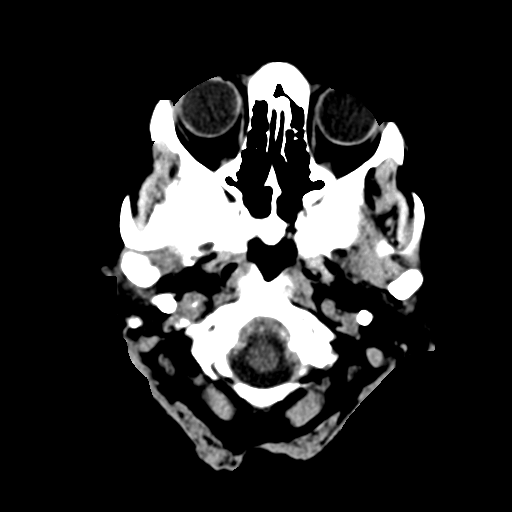
[im 3/30  bone]
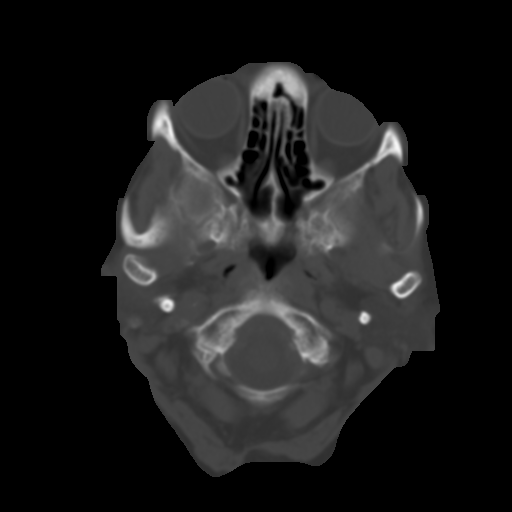
[im 6/30  brain]
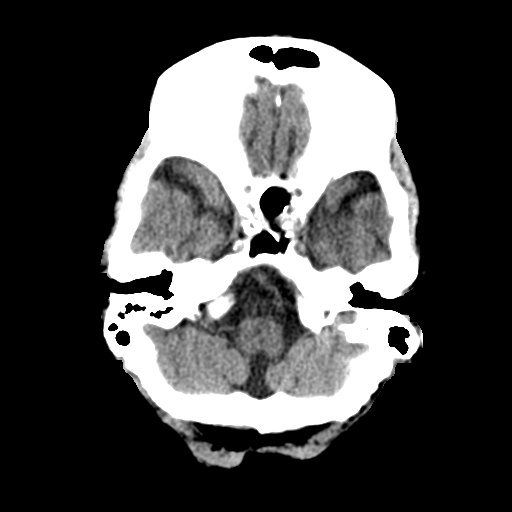
[im 9/30  brain]
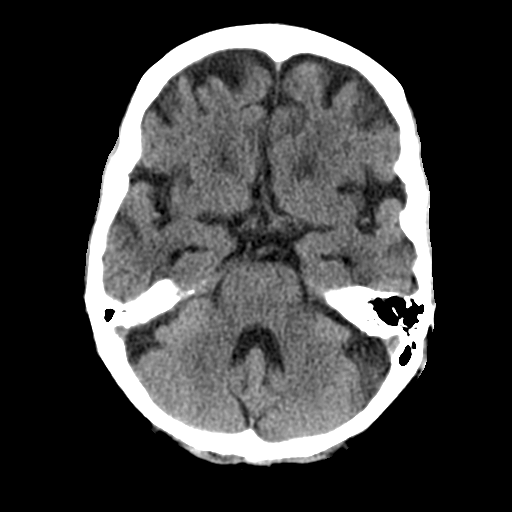
[im 12/30  brain]
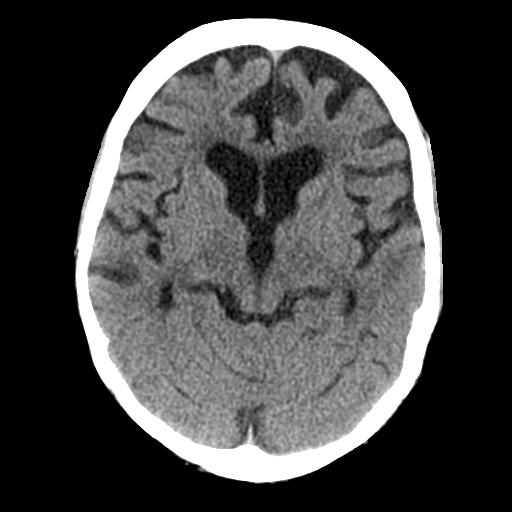
[im 16/30  brain]
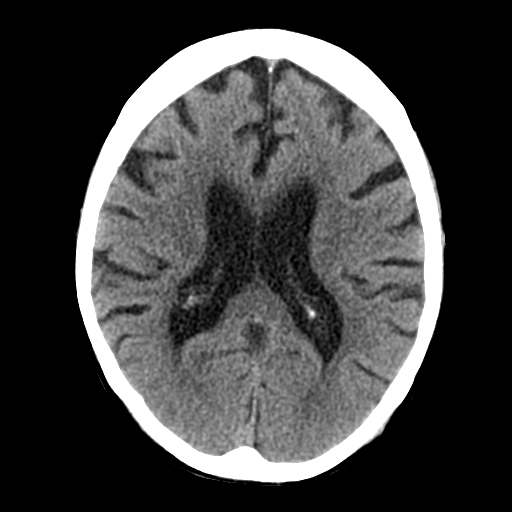
[im 16/30  bone]
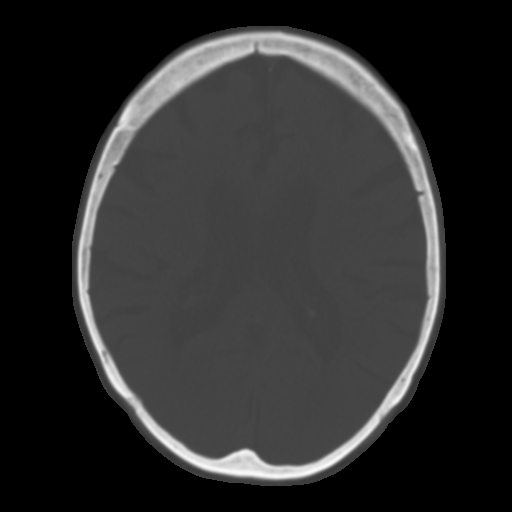
[im 19/30  brain]
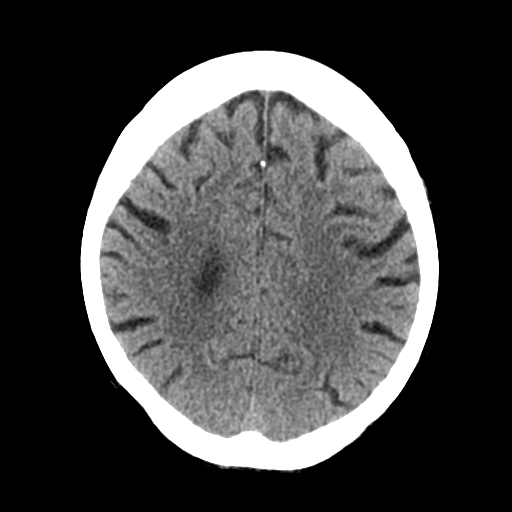
[im 22/30  brain]
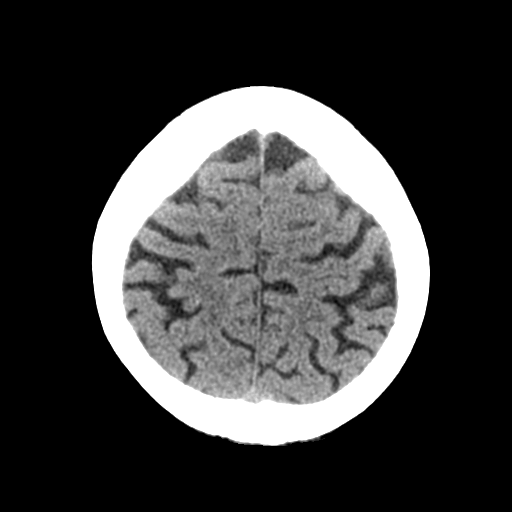
[im 25/30  brain]
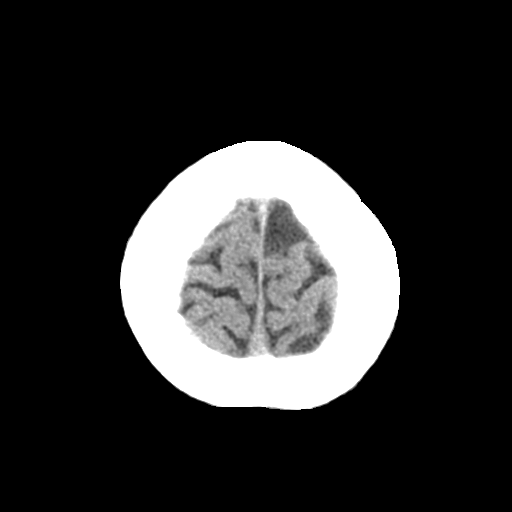
[im 28/30  brain]
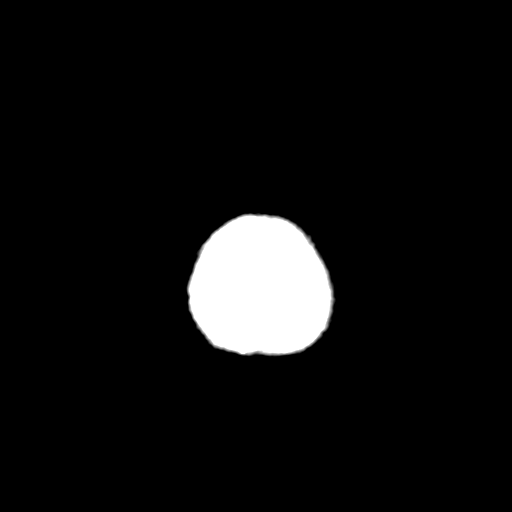
[im 28/30  bone]
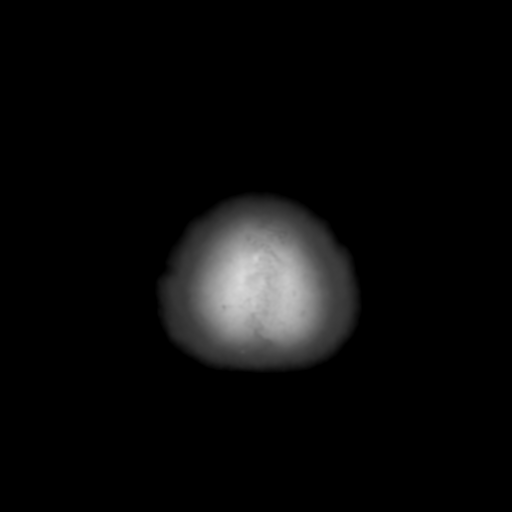

[Series 5: coronal soft tissue · coronal · 0.31mm/px · 3 of 64 slices shown]
[im 22/64  brain]
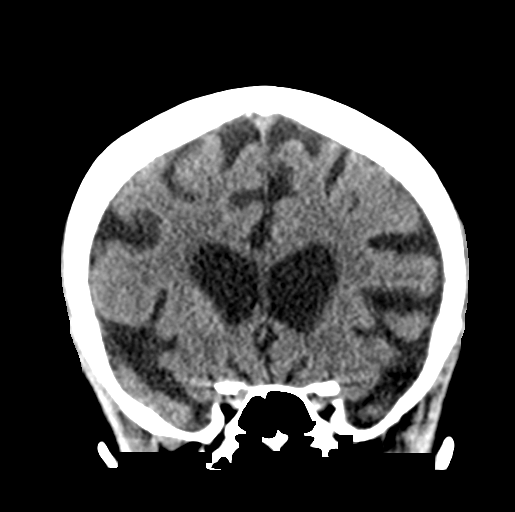
[im 29/64  brain]
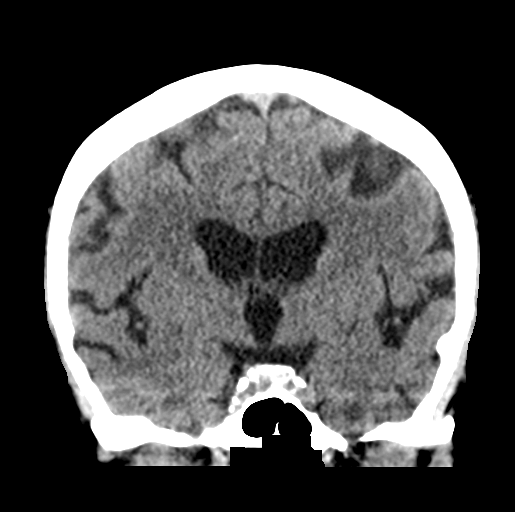
[im 36/64  brain]
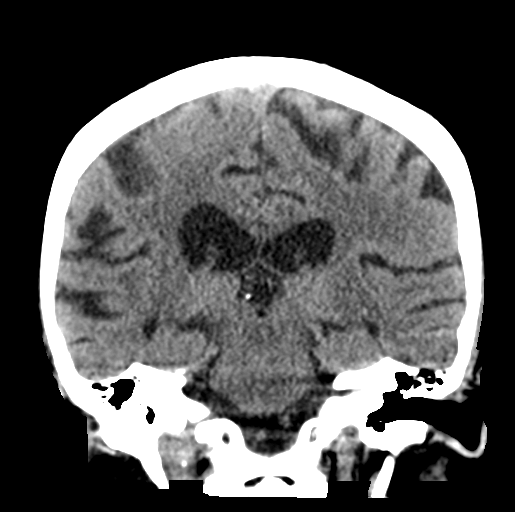

[Series 6: sagittal soft tissue · sagittal · 0.29mm/px · 3 of 52 slices shown]
[im 18/52  brain]
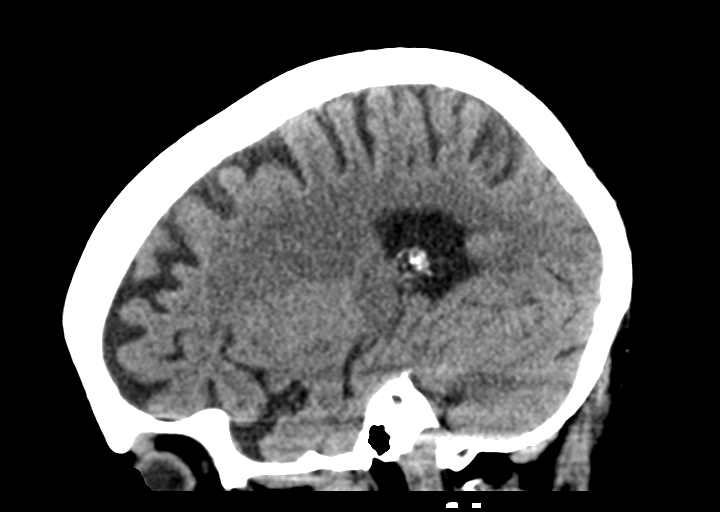
[im 26/52  brain]
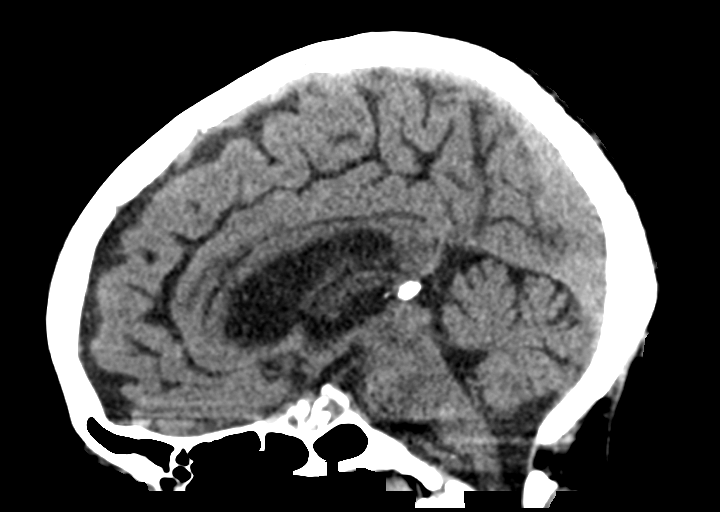
[im 35/52  brain]
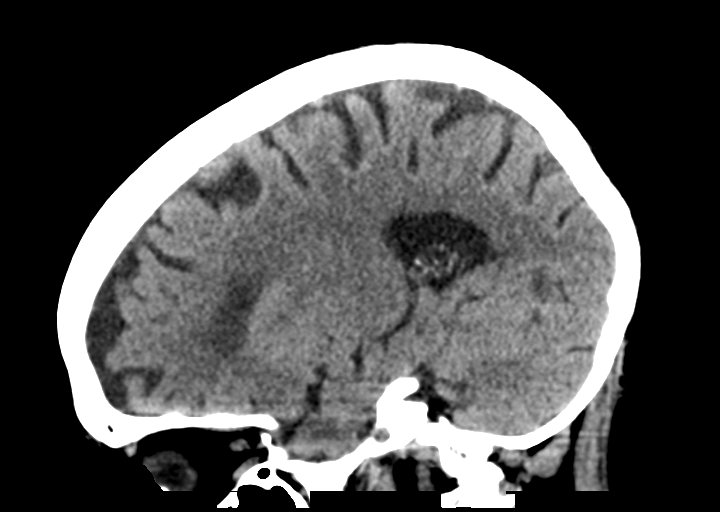

[15 of 47 positions shown; findings below may reference images not displayed]

FINDINGS: Brain: Mild, likely age-appropriate atrophy with sulcal prominence
centralized volume loss with commensurate ex vacuo dilatation of the
ventricular system. Scattered minimal periventricular hypodensities
compatible microvascular ischemic disease. Gray-white
differentiation is otherwise well maintained without CT evidence of
acute large territory infarct. No intraparenchymal or extra-axial
mass or hemorrhage. Normal configuration of the ventricles and
basilar cisterns. No midline shift.

Vascular: Intracranial atherosclerosis.

If skull: No displaced calvarial fracture with special attention
paid to the forehead.

Sinuses/Orbits: Limited visualization of the paranasal sinuses and
mastoid air cells is normal. No air-fluid levels. Post bilateral
cataract surgery.

Other: Suspected minimal amount of soft tissue swelling about the
midline of the forehead (image 14, series 2; sagittal image 29,
series 6) without associated radiopaque foreign body or displaced
calvarial fracture.
IMPRESSION: 1. Minimal amount of soft tissue swelling about the midline of the
forehead without associated displaced calvarial fracture, radiopaque
foreign body or acute intracranial process.
2. Mild, likely age-appropriate, atrophy and microvascular ischemic
disease.

## 2019-07-23 DEATH — deceased
